# Patient Record
Sex: Male | Born: 1970 | Hispanic: No | Marital: Single | Smoking: Current every day smoker
Health system: Southern US, Community
[De-identification: ages and names within clinical notes are randomized; demographics above are authoritative.]

## PROBLEM LIST (undated history)

## (undated) DIAGNOSIS — M549 Dorsalgia, unspecified: Secondary | ICD-10-CM

## (undated) HISTORY — PX: HERNIA REPAIR: SHX51

---

## 2000-09-30 ENCOUNTER — Encounter: Payer: Self-pay | Admitting: Emergency Medicine

## 2000-09-30 ENCOUNTER — Emergency Department (HOSPITAL_COMMUNITY): Admission: EM | Admit: 2000-09-30 | Discharge: 2000-10-01 | Payer: Self-pay | Admitting: Emergency Medicine

## 2002-01-27 ENCOUNTER — Emergency Department (HOSPITAL_COMMUNITY): Admission: EM | Admit: 2002-01-27 | Discharge: 2002-01-27 | Payer: Self-pay | Admitting: *Deleted

## 2003-03-13 ENCOUNTER — Ambulatory Visit (HOSPITAL_COMMUNITY): Admission: RE | Admit: 2003-03-13 | Discharge: 2003-03-13 | Payer: Self-pay | Admitting: General Surgery

## 2012-07-30 ENCOUNTER — Emergency Department (HOSPITAL_COMMUNITY)
Admission: EM | Admit: 2012-07-30 | Discharge: 2012-07-30 | Disposition: A | Discharge status: Home / Self Care | Payer: Self-pay | Attending: Emergency Medicine | Admitting: Emergency Medicine

## 2012-07-30 ENCOUNTER — Encounter (HOSPITAL_COMMUNITY): Payer: Self-pay | Admitting: Physical Medicine and Rehabilitation

## 2012-07-30 DIAGNOSIS — M549 Dorsalgia, unspecified: Secondary | ICD-10-CM

## 2012-07-30 DIAGNOSIS — IMO0002 Reserved for concepts with insufficient information to code with codable children: Secondary | ICD-10-CM | POA: Insufficient documentation

## 2012-07-30 DIAGNOSIS — F17200 Nicotine dependence, unspecified, uncomplicated: Secondary | ICD-10-CM | POA: Insufficient documentation

## 2012-07-30 DIAGNOSIS — R296 Repeated falls: Secondary | ICD-10-CM | POA: Insufficient documentation

## 2012-07-30 DIAGNOSIS — Y9389 Activity, other specified: Secondary | ICD-10-CM | POA: Insufficient documentation

## 2012-07-30 DIAGNOSIS — Y929 Unspecified place or not applicable: Secondary | ICD-10-CM | POA: Insufficient documentation

## 2012-07-30 DIAGNOSIS — M5430 Sciatica, unspecified side: Secondary | ICD-10-CM

## 2012-07-30 HISTORY — DX: Dorsalgia, unspecified: M54.9

## 2012-07-30 MED ORDER — HYDROCODONE-ACETAMINOPHEN 5-325 MG PO TABS
2.0000 | ORAL_TABLET | Freq: Once | ORAL | Status: AC
  Administered 2012-07-30: 2 via ORAL
  Filled 2012-07-30: qty 2

## 2012-07-30 MED ORDER — IBUPROFEN 400 MG PO TABS
600.0000 mg | ORAL_TABLET | Freq: Once | ORAL | Status: AC
  Administered 2012-07-30: 600 mg via ORAL
  Filled 2012-07-30: qty 1

## 2012-07-30 MED ORDER — PREDNISONE 20 MG PO TABS: ORAL_TABLET | ORAL | Status: AC

## 2012-07-30 MED ORDER — HYDROCODONE-ACETAMINOPHEN 5-500 MG PO TABS: 1.0000 | ORAL_TABLET | Freq: Four times a day (QID) | ORAL | Status: AC | PRN

## 2012-07-30 NOTE — ED Notes (Signed)
Pt presents to department for evaluation of lower back pain radiating to both legs. Ongoing x2 days. Pt states chronic issues with same. 9/10 pain at the time. Denies numbness/tingling. Able to move all extremities. He is alert and oriented x4.

## 2012-07-30 NOTE — ED Provider Notes (Signed)
History  Scribed for Joseph Roots, MD, the patient was seen in room TR07C/TR07C. This chart was scribed by Candelaria Stagers. The patient's care started at 11:47 AM   CSN: [id number]  Arrival date & time 07/30/12  1133   First MD Initiated Contact with Patient 07/30/12 1142      Chief Complaint  Patient presents with  . Back Pain     The history is provided by the patient. No language interpreter was used.   Roland Rack is a 41 y.o. male who presents to the Emergency Department complaining of sharp back pain that started about eight months ago and has gotten worse over the last two days after working on his car.  Pt reports that the pain radiates down his legs.  Sitting makes the pain worse, and certain positions changes make worse. Pt constant. Radiating. No numbness or weakness. No problems walking. No gi or gu c/o. No fever or chills.   Past Medical History  Diagnosis Date  . Back pain     No past surgical history on file.  History reviewed. No pertinent family history.  History  Substance Use Topics  . Smoking status: Current Every Day Smoker    Types: Cigarettes  . Smokeless tobacco: Not on file  . Alcohol Use: No      Review of Systems  Constitutional: Negative for fever.  Gastrointestinal: Negative for vomiting.  Musculoskeletal: Positive for back pain.  Neurological: Negative for weakness and numbness.  All other systems reviewed and are negative.    Allergies  Review of patient's allergies indicates no known allergies.  Home Medications  No current outpatient prescriptions on file.  BP 105/65  Pulse 77  Temp 98.6 F (37 C) (Oral)  Resp 18  SpO2 99%  Physical Exam  Nursing note and vitals reviewed. Constitutional: He is oriented to person, place, and time. He appears well-developed and well-nourished. No distress.  HENT:  Head: Normocephalic and atraumatic.  Neck: Neck supple. No tracheal deviation present.  Cardiovascular: Normal rate.    Pulmonary/Chest: Effort normal. No respiratory distress.  Abdominal: Soft. There is no tenderness.  Musculoskeletal: Normal range of motion.       Spine non tender.  Lumbar muscular tenderness.  CTLS spine, non tender, aligned, no step off. Straight leg raise pos on right.  Neurological: He is alert and oriented to person, place, and time. He displays normal reflexes.       Motor intact bil. Steady gait.   Skin: Skin is warm and dry.  Psychiatric: He has a normal mood and affect. His behavior is normal.    ED Course  Procedures   DIAGNOSTIC STUDIES: Oxygen Saturation is 99% on room air, normal by my interpretation.    COORDINATION OF CARE:        MDM  I personally performed the services described in this documentation, which was scribed in my presence. The recorded information has been reviewed and is accurate.   Pt has ride, does not have to drive. vicodin po. Motrin po.  rx for home. Discussed need for pcp f/u and specialty referral then if symptoms persists or worsen.    Joseph Roots, MD 07/30/12 [phone]

## 2012-07-30 NOTE — ED Notes (Signed)
Pt laying in bed, reports he fell yesterday while working on his car and landed on bottom. Pt reports pain starts in sacrum and radiates down buttocks and bilateral lower extremities. Denies numbness and tingling.

## 2013-10-16 ENCOUNTER — Ambulatory Visit (INDEPENDENT_AMBULATORY_CARE_PROVIDER_SITE_OTHER): Payer: No Typology Code available for payment source | Admitting: Family Medicine

## 2013-10-16 ENCOUNTER — Ambulatory Visit: Payer: No Typology Code available for payment source

## 2013-10-16 VITALS — BP 98/64 | HR 74 | Temp 97.8°F | Resp 16 | Ht 67.75 in | Wt 128.0 lb

## 2013-10-16 DIAGNOSIS — M549 Dorsalgia, unspecified: Secondary | ICD-10-CM

## 2013-10-16 DIAGNOSIS — M25559 Pain in unspecified hip: Secondary | ICD-10-CM

## 2013-10-16 DIAGNOSIS — R634 Abnormal weight loss: Secondary | ICD-10-CM

## 2013-10-16 LAB — COMPREHENSIVE METABOLIC PANEL WITH GFR
Albumin: 4.6 g/dL (ref 3.5–5.2)
Alkaline Phosphatase: 66 U/L (ref 39–117)
BUN: 12 mg/dL (ref 6–23)
Creat: 0.87 mg/dL (ref 0.50–1.35)
Glucose, Bld: 91 mg/dL (ref 70–99)
Potassium: 4.2 meq/L (ref 3.5–5.3)

## 2013-10-16 LAB — COMPREHENSIVE METABOLIC PANEL
ALT: 11 U/L (ref 0–53)
AST: 15 U/L (ref 0–37)
CO2: 29 mEq/L (ref 19–32)
Calcium: 9.9 mg/dL (ref 8.4–10.5)
Chloride: 104 mEq/L (ref 96–112)
Sodium: 141 mEq/L (ref 135–145)
Total Bilirubin: 0.4 mg/dL (ref 0.2–1.2)
Total Protein: 7.5 g/dL (ref 6.0–8.3)

## 2013-10-16 LAB — TSH: TSH: 1.472 u[IU]/mL (ref 0.350–4.500)

## 2013-10-16 LAB — POCT CBC
Granulocyte percent: 59.9 %G (ref 37–80)
HCT, POC: 48.3 % (ref 43.5–53.7)
Hemoglobin: 14.7 g/dL (ref 14.1–18.1)
Lymph, poc: 2 (ref 0.6–3.4)
MCH, POC: 24.1 pg — AB (ref 27–31.2)
MCHC: 30.4 g/dL — AB (ref 31.8–35.4)
MCV: 79.1 fL — AB (ref 80–97)
MID (cbc): 0.5 (ref 0–0.9)
MPV: 12.7 fL (ref 0–99.8)
POC Granulocyte: 3.7 (ref 2–6.9)
POC LYMPH PERCENT: 32.5 %L (ref 10–50)
POC MID %: 7.6 %M (ref 0–12)
Platelet Count, POC: 203 10*3/uL (ref 142–424)
RBC: 6.11 M/uL (ref 4.69–6.13)
RDW, POC: 13.8 %
WBC: 6.1 10*3/uL (ref 4.6–10.2)

## 2013-10-16 NOTE — Patient Instructions (Signed)
Back Pain, Adult Low back pain is very common. About 1 in 5 people have back pain.The cause of low back pain is rarely dangerous. The pain often gets better over time.About half of people with a sudden onset of back pain feel better in just 2 weeks. About 8 in 10 people feel better by 6 weeks.  CAUSES Some common causes of back pain include:  Strain of the muscles or ligaments supporting the spine.  Wear and tear (degeneration) of the spinal discs.  Arthritis.  Direct injury to the back. DIAGNOSIS Most of the time, the direct cause of low back pain is not known.However, back pain can be treated effectively even when the exact cause of the pain is unknown.Answering your caregiver's questions about your overall health and symptoms is one of the most accurate ways to make sure the cause of your pain is not dangerous. If your caregiver needs more information, he or she may order lab work or imaging tests (X-rays or MRIs).However, even if imaging tests show changes in your back, this usually does not require surgery. HOME CARE INSTRUCTIONS For many people, back pain returns.Since low back pain is rarely dangerous, it is often a condition that people can learn to manageon their own.   Remain active. It is stressful on the back to sit or stand in one place. Do not sit, drive, or stand in one place for more than 30 minutes at a time. Take short walks on level surfaces as soon as pain allows.Try to increase the length of time you walk each day.  Do not stay in bed.Resting more than 1 or 2 days can delay your recovery.  Do not avoid exercise or work.Your body is made to move.It is not dangerous to be active, even though your back may hurt.Your back will likely heal faster if you return to being active before your pain is gone.  Pay attention to your body when you bend and lift. Many people have less discomfortwhen lifting if they bend their knees, keep the load close to their bodies,and  avoid twisting. Often, the most comfortable positions are those that put less stress on your recovering back.  Find a comfortable position to sleep. Use a firm mattress and lie on your side with your knees slightly bent. If you lie on your back, put a pillow under your knees.  Only take over-the-counter or prescription medicines as directed by your caregiver. Over-the-counter medicines to reduce pain and inflammation are often the most helpful.Your caregiver may prescribe muscle relaxant drugs.These medicines help dull your pain so you can more quickly return to your normal activities and healthy exercise.  Put ice on the injured area.  Put ice in a plastic bag.  Place a towel between your skin and the bag.  Leave the ice on for 15-20 minutes, 03-04 times a day for the first 2 to 3 days. After that, ice and heat may be alternated to reduce pain and spasms.  Ask your caregiver about trying back exercises and gentle massage. This may be of some benefit.  Avoid feeling anxious or stressed.Stress increases muscle tension and can worsen back pain.It is important to recognize when you are anxious or stressed and learn ways to manage it.Exercise is a great option. SEEK MEDICAL CARE IF:  You have pain that is not relieved with rest or medicine.  You have pain that does not improve in 1 week.  You have new symptoms.  You are generally not feeling well. SEEK   IMMEDIATE MEDICAL CARE IF:   You have pain that radiates from your back into your legs.  You develop new bowel or bladder control problems.  You have unusual weakness or numbness in your arms or legs.  You develop nausea or vomiting.  You develop abdominal pain.  You feel faint. Document Released: 08/03/2005 Document Revised: 02/02/2012 Document Reviewed: 12/22/2010 [employer] Patient Information 2014 [employer].  

## 2013-10-16 NOTE — Progress Notes (Addendum)
Chief Complaint:  Chief Complaint  Patient presents with  . Back Pain    x 2 years  . Leg Pain    HPI: Joseph Terrell is a 43 y.o. male who is here for leg and back pain. Back pain has been going on for x 2 years [place] recommended surgery but would like a second opinion. Has lost 46lbs in the last 6 mths due the pain. Has taken OTC pain medications but they have not helped at all. Sometimes he can't pick his son up, he can't walk longer than 30 min , he can't sit for long, sharp pain. He was seem by another doctor and was given pain meds but he states they did not help so he stopped using it.. He has taken vicodin, prednisone, and and ibuprofen. HE can only do things 30 minutes at a time, sit , stand. Even going to the bathroom hurst him. He has not had any night sweats or   Back pain started 2 years ago after he lift a door that was 450 lbs at home ,  And then he started having sharp debilitating pain whichdoes not allow his to do normal things. 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 He worked 1-2 days last year and had to stop and this year he only was able to work 1 day both jobs required lifting which he could not do. 4 months ago he went home and He saw an [place] doctor who gave him medicine which he did not take, he wanted to do surgery but the patient was scared and did not take the medicine or do the surgery, this was only after 1 visit. HE saw another doctor. HE is scheduled to see a specialist on March 18 for his back but can't wait that long. Denies incontinence or numbness/tinglgin. He can't eat because he is in so much pain so to ease his pain he  He has 10/10 pain. HE would like to go back to work and be able to pick up his kisd, ages 68 and 2   He has a specialist in [facility] for back pain on March 18th but wants something sooner.   Past Medical History    Diagnosis Date  . Back pain    Past Surgical History  Procedure Laterality Date  . Hernia repair     History   Social History  . Marital Status: Married with 2 kids, ages 21 and 2    Spouse Name: N/A    Number of Children: N/A  . Years of Education: N/A   Social History Main Topics  . Smoking status: Current Every Day Smoker    Types: Cigarettes  . Smokeless tobacco: None  . Alcohol Use: No  . Drug Use: No  . Sexual Activity:    Other Topics Concern  . None   Social History Narrative  . None   No family history on file. No Known Allergies Prior to Admission medications   Medication Sig Start Date End Date Taking? Authorizing Provider  HYDROcodone-acetaminophen (VICODIN) 5-500 MG per tablet Take 1-2 tablets by mouth every 6 (six) hours as needed for pain. 07/30/12  Yes Suzi Roots, MD  predniSONE (DELTASONE) 20 MG tablet 3 po once a day for 2 days, then 2 po once a day for 3 days, then 1  po once a day for 3 days 07/30/12   Suzi RootsKevin E Steinl, MD     ROS: The patient denies fevers, chills, night sweats, chest pain, palpitations, wheezing, dyspnea on exertion, nausea, vomiting, abdominal pain, dysuria, hematuria, melena, numbness, weakness, or tingling.   All other systems have been reviewed and were otherwise negative with the exception of those mentioned in the HPI and as above.    PHYSICAL EXAM: Filed Vitals:   10/16/13 0956  BP: 98/64  Pulse: 74  Temp: 97.8 F (36.6 C)  Resp: 16   Filed Vitals:   10/16/13 0956  Height: 5' 7.75" (1.721 m)  Weight: 128 lb (58.06 kg)   Body mass index is 19.6 kg/(m^2).  General: Alert, no acute distress HEENT:  Normocephalic, atraumatic, oropharynx patent. EOMI, PERRLA, no appreciable thyrpoidmegaly Cardiovascular:  Regular rate and rhythm, no rubs murmurs or gallops.  No Carotid bruits, radial pulse intact. No pedal edema.  Respiratory: Clear to auscultation bilaterally.  No wheezes, rales, or rhonchi.  No cyanosis, no use  of accessory musculature GI: No organomegaly, abdomen is soft and non-tender, positive bowel sounds.  No masses. Skin: No rashes. Neurologic: Facial musculature symmetric. Psychiatric: Patient is appropriate throughout our interaction. Lymphatic: No cervical lymphadenopathy Musculoskeletal: Gait intact. + paramsk tenderness  Bilateral LB, right greater than left, tenderness down to piriformis  Full ROM 5/5 strength, 2/2 DTRs No saddle anesthesia Straight leg positive left greater than right  Hip and knee exam--normal    LABS: Results for orders placed in visit on 10/16/13  POCT CBC      Result Value Ref Range   WBC 6.1  4.6 - 10.2 K/uL   Lymph, poc 2.0  0.6 - 3.4   POC LYMPH PERCENT 32.5  10 - 50 %L   MID (cbc) 0.5  0 - 0.9   POC MID % 7.6  0 - 12 %M   POC Granulocyte 3.7  2 - 6.9   Granulocyte percent 59.9  37 - 80 %G   RBC 6.11  4.69 - 6.13 M/uL   Hemoglobin 14.7  14.1 - 18.1 g/dL   HCT, POC 40.948.3  81.143.5 - 53.7 %   MCV 79.1 (*) 80 - 97 fL   MCH, POC 24.1 (*) 27 - 31.2 pg   MCHC 30.4 (*) 31.8 - 35.4 g/dL   RDW, POC 91.413.8     Platelet Count, POC 203  142 - 424 K/uL   MPV 12.7  0 - 99.8 fL     EKG/XRAY:   [facility]. No fx or dislocation of hips or l spine   ASSESSMENT/PLAN: Encounter Diagnoses  Name Primary?  . Back pain Yes  . Loss of weight   . Hip pain    ? Etiology of his weight loss due to back pain, I do not suspect any immunocompromise. No LAD, no fevers, chills, night sweats. CMP and TSH pending He states it is strictly from back pain, he can't eat due to pain so he just smokes He deneis numbness, weakness or tingling; strictly pain He was given prednsione, ibuprofen and vicodin in the past and I checked with pharmacy that he got these meds once and sicne they did not help him he did nto take them He would like tos ee a specialist, he ahs an appt on March 18 with a specialist but he would like one sooner I will refer him to  [facility] for further eval today since that  is what he is requesting Appt made for 2:45 with Dr Farris Has F/u prn  Gross sideeffects, risk and benefits, and alternatives of medications d/w patient. Patient is aware that all medications have potential sideeffects and we are unable to predict every sideeffect or drug-drug interaction that may occur.  Hamilton Capri PHUONG, DO 10/16/2013 12:51 PM

## 2013-10-26 ENCOUNTER — Encounter: Payer: Self-pay | Admitting: Family Medicine

## 2021-03-25 ENCOUNTER — Emergency Department (HOSPITAL_COMMUNITY)
Admission: EM | Admit: 2021-03-25 | Discharge: 2021-03-26 | Disposition: A | Discharge status: Home / Self Care | Payer: Self-pay | Attending: Emergency Medicine | Admitting: Emergency Medicine

## 2021-03-25 ENCOUNTER — Encounter (HOSPITAL_COMMUNITY): Payer: Self-pay | Admitting: Pharmacy Technician

## 2021-03-25 ENCOUNTER — Other Ambulatory Visit: Payer: Self-pay

## 2021-03-25 DIAGNOSIS — R519 Headache, unspecified: Secondary | ICD-10-CM | POA: Insufficient documentation

## 2021-03-25 DIAGNOSIS — R200 Anesthesia of skin: Secondary | ICD-10-CM | POA: Insufficient documentation

## 2021-03-25 DIAGNOSIS — F17210 Nicotine dependence, cigarettes, uncomplicated: Secondary | ICD-10-CM | POA: Insufficient documentation

## 2021-03-25 DIAGNOSIS — R42 Dizziness and giddiness: Secondary | ICD-10-CM | POA: Insufficient documentation

## 2021-03-25 LAB — COMPREHENSIVE METABOLIC PANEL
ALT: 18 U/L (ref 0–44)
AST: 19 U/L (ref 15–41)
Albumin: 3.9 g/dL (ref 3.5–5.0)
Alkaline Phosphatase: 55 U/L (ref 38–126)
Anion gap: 5 (ref 5–15)
BUN: 15 mg/dL (ref 6–20)
CO2: 27 mmol/L (ref 22–32)
Calcium: 9.4 mg/dL (ref 8.9–10.3)
Chloride: 107 mmol/L (ref 98–111)
Creatinine, Ser: 1.24 mg/dL (ref 0.61–1.24)
GFR, Estimated: >60 mL/min (ref >60)
Glucose, Bld: 86 mg/dL (ref 70–99)
Potassium: 4.2 mmol/L (ref 3.5–5.1)
Sodium: 139 mmol/L (ref 135–145)
Total Bilirubin: 0.6 mg/dL (ref 0.3–1.2)
Total Protein: 6.3 g/dL — ABNORMAL LOW (ref 6.5–8.1)

## 2021-03-25 LAB — CBC WITH DIFFERENTIAL/PLATELET
Abs Immature Granulocytes: 0.01 10*3/uL (ref 0.00–0.07)
Basophils Absolute: 0.1 10*3/uL (ref 0.0–0.1)
Basophils Relative: 1 %
Eosinophils Absolute: 0.1 10*3/uL (ref 0.0–0.5)
Eosinophils Relative: 2 %
HCT: 44.7 % (ref 39.0–52.0)
Hemoglobin: 13.4 g/dL (ref 13.0–17.0)
Immature Granulocytes: 0 %
Lymphocytes Relative: 26 %
Lymphs Abs: 1.8 10*3/uL (ref 0.7–4.0)
MCH: 23.4 pg — ABNORMAL LOW (ref 26.0–34.0)
MCHC: 30 g/dL (ref 30.0–36.0)
MCV: 78.1 fL — ABNORMAL LOW (ref 80.0–100.0)
Monocytes Absolute: 0.6 10*3/uL (ref 0.1–1.0)
Monocytes Relative: 9 %
Neutro Abs: 4.3 10*3/uL (ref 1.7–7.7)
Neutrophils Relative %: 62 %
Platelets: 247 10*3/uL (ref 150–400)
RBC: 5.72 MIL/uL (ref 4.22–5.81)
RDW: 13.6 % (ref 11.5–15.5)
WBC: 6.8 10*3/uL (ref 4.0–10.5)
nRBC: 0 % (ref 0.0–0.2)

## 2021-03-25 NOTE — ED Triage Notes (Signed)
Pt here with reports of dizziness, headache, fatigue, L arm numbness X6 days. Pt also complains of decreased po intake. Denies known sick contacts.

## 2021-03-25 NOTE — ED Provider Notes (Signed)
Emergency Medicine Provider Triage Evaluation Note  Joseph Terrell , a 50 y.o. male  was evaluated in triage.  Pt complains of generalized weakness, left arm pain, fatigue for the past 2 weeks.  Also endorsing weight loss, states he is unable to drive, move his left arm these episodes occur intermittently.  He does not have a primary care physician, does endorse tobacco use up until 2019 but quit.  Review of Systems  Positive: Fatigue, weakness, left arm numbnes Negative: Headache, chest pain, shortness of breath  Physical Exam  BP 114/77 (BP Location: Left Arm)   Pulse 80   Temp 98.4 F (36.9 C)   Resp 16   SpO2 99%  Gen:   Cachectic, temporal wasting Resp:  Normal effort  MSK:   Moves extremities without difficulty  Other:  Full range of motion of the left arm, with bilateral upper equal strength.  Patient appears chronically ill, denies any medical history.  Medical Decision Making  Medically screening exam initiated at 4:43 PM.  Appropriate orders placed.  Olamide Wheeland was informed that the remainder of the evaluation will be completed by another provider, this initial triage assessment does not replace that evaluation, and the importance of remaining in the ED until their evaluation is complete.     Claude Manges, PA-C 03/25/21 1645    Margarita Grizzle, MD 03/25/21 2259

## 2021-03-26 ENCOUNTER — Emergency Department (HOSPITAL_COMMUNITY): Payer: Self-pay

## 2021-03-26 MED ORDER — MECLIZINE HCL 25 MG PO TABS
25.0000 mg | ORAL_TABLET | Freq: Once | ORAL | Status: AC
  Administered 2021-03-26: 25 mg via ORAL
  Filled 2021-03-26: qty 1

## 2021-03-26 MED ORDER — MECLIZINE HCL 25 MG PO TABS: 25.0000 mg | ORAL_TABLET | Freq: Three times a day (TID) | ORAL | 0 refills | Status: AC | PRN

## 2021-03-26 NOTE — ED Notes (Signed)
Pt taken to CT.

## 2021-03-26 NOTE — ED Provider Notes (Signed)
[facility] EMERGENCY DEPARTMENT Provider Note   CSN: [id number] Arrival date & time: 03/25/21  1603     History Chief Complaint  Patient presents with   Fatigue   Dizziness   Headache    Joseph Terrell is a 50 y.o. male.  Patient is a 50 year old male with no significant past medical history presenting today with complaints of dizziness.  For the past two days he has felt, off balance and has experienced what he describes as a "spinning sensation".  He denies chest pain, nausea, headaches, or fevers.  He does report some numbness of the left arm, but no weakness.    The history is provided by the patient.  Dizziness Quality:  Imbalance and room spinning Severity:  Moderate Onset quality:  Gradual Duration:  2 days Timing:  Intermittent Progression:  Worsening Chronicity:  New Context: standing up   Relieved by:  Nothing Worsened by:  Standing up Ineffective treatments:  None tried Associated symptoms: headaches   Headache Associated symptoms: dizziness       Past Medical History:  Diagnosis Date   Back pain     There are no problems to display for this patient.   Past Surgical History:  Procedure Laterality Date   HERNIA REPAIR         No family history on file.  Social History   Tobacco Use   Smoking status: Every Day    Types: Cigarettes  Substance Use Topics   Alcohol use: No   Drug use: No    Home Medications Prior to Admission medications   Medication Sig Start Date End Date Taking? Authorizing Provider  HYDROcodone-acetaminophen (VICODIN) 5-500 MG per tablet Take 1-2 tablets by mouth every 6 (six) hours as needed for pain. 07/30/12   Cathren Laine, MD  predniSONE (DELTASONE) 20 MG tablet 3 po once a day for 2 days, then 2 po once a day for 3 days, then 1 po once a day for 3 days 07/30/12   Cathren Laine, MD    Allergies    Patient has no known allergies.  Review of Systems   Review of Systems  Neurological:   Positive for dizziness and headaches.  All other systems reviewed and are negative.  Physical Exam Updated Vital Signs BP 114/68 (BP Location: Left Arm)   Pulse 64   Temp 98.4 F (36.9 C)   Resp 16   SpO2 100%   Physical Exam Vitals and nursing note reviewed.  Constitutional:      General: He is not in acute distress.    Appearance: He is well-developed. He is not diaphoretic.  HENT:     Head: Normocephalic and atraumatic.  Eyes:     General: No visual field deficit.    Extraocular Movements: Extraocular movements intact.     Pupils: Pupils are equal, round, and reactive to light.  Cardiovascular:     Rate and Rhythm: Normal rate and regular rhythm.     Heart sounds: No murmur heard.   No friction rub.  Pulmonary:     Effort: Pulmonary effort is normal. No respiratory distress.     Breath sounds: Normal breath sounds. No wheezing or rales.  Abdominal:     General: Bowel sounds are normal. There is no distension.     Palpations: Abdomen is soft.     Tenderness: There is no abdominal tenderness.  Musculoskeletal:        General: Normal range of motion.  Cervical back: Normal range of motion and neck supple.  Skin:    General: Skin is warm and dry.  Neurological:     Mental Status: He is alert and oriented to person, place, and time.     Cranial Nerves: No cranial nerve deficit, dysarthria or facial asymmetry.     Coordination: Coordination normal.    ED Results / Procedures / Treatments   Labs (all labs ordered are listed, but only abnormal results are displayed) Labs Reviewed  CBC WITH DIFFERENTIAL/PLATELET - Abnormal; Notable for the following components:      Result Value   MCV 78.1 (*)    MCH 23.4 (*)    All other components within normal limits  COMPREHENSIVE METABOLIC PANEL - Abnormal; Notable for the following components:   Total Protein 6.3 (*)    All other components within normal limits    EKG None  Radiology No results  found.  Procedures Procedures   Medications Ordered in ED Medications  meclizine (ANTIVERT) tablet 25 mg (has no administration in time range)    ED Course  I have reviewed the triage vital signs and the nursing notes.  Pertinent labs & imaging results that were available during my care of the patient were reviewed by me and considered in my medical decision making (see chart for details).    MDM Rules/Calculators/A&P  Patient is a 50 year old male presenting with complaints of dizziness as described in the HPI.  I suspect vertigo as the etiology.  He is neurologically intact and head CT and laboratory studies are unremarkable.  At this point, I feel as though discharge is appropriate with meclizine and as needed return.  Final Clinical Impression(s) / ED Diagnoses Final diagnoses:  None    Rx / DC Orders ED Discharge Orders     None        Geoffery Lyons, MD 03/26/21 [id number]

## 2021-03-26 NOTE — Discharge Instructions (Addendum)
Begin taking meclizine as prescribed.  Return to the emergency department if you develop worsening dizziness, severe headache, or other new and concerning symptoms.

## 2021-03-26 NOTE — ED Notes (Signed)
Patient transported to CT 

## 2022-02-12 IMAGING — CT CT HEAD W/O CM
4 series · 17 of 47 positions shown, 19 images · non-contrast
Comparison: None.

CLINICAL DATA: Dizziness and headaches

EXAM:
CT HEAD WITHOUT CONTRAST
TECHNIQUE: Contiguous axial images were obtained from the base of the skull
through the vertex without intravenous contrast.

[Series 3: head wo · axial · 0.43mm/px · z∈[+946,+1061]mm · 7 of 31 slices shown, 9 images]
[im 4/31  brain]
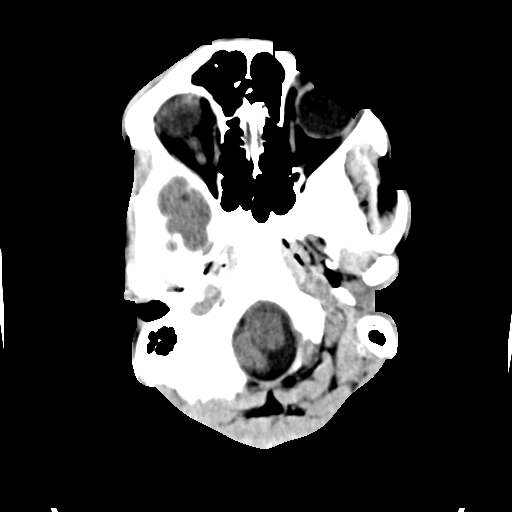
[im 4/31  bone]
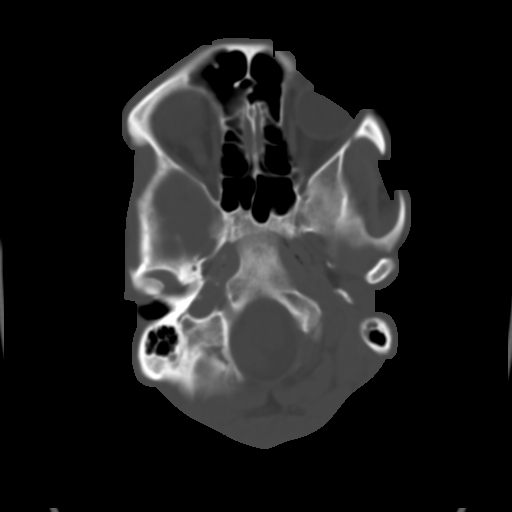
[im 8/31  brain]
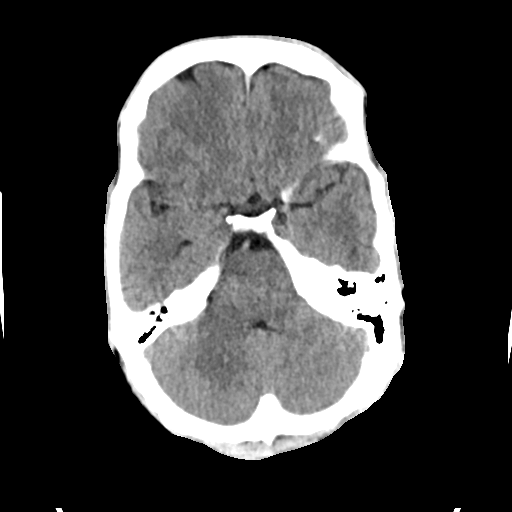
[im 12/31  brain]
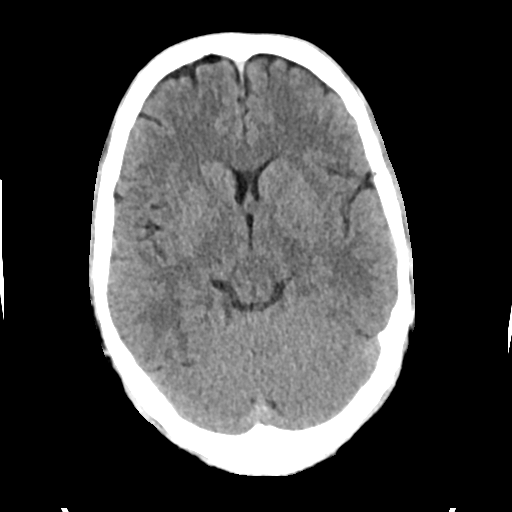
[im 16/31  brain]
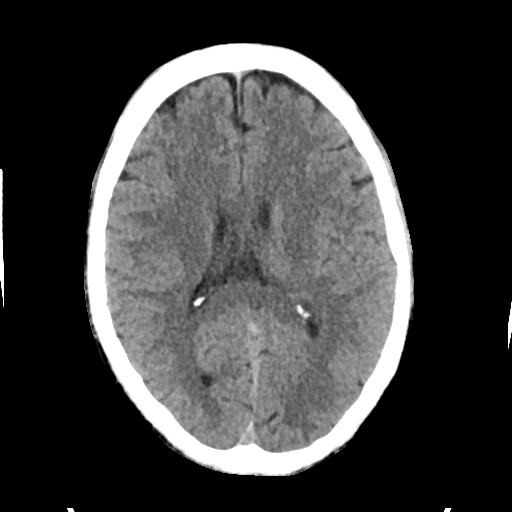
[im 19/31  brain]
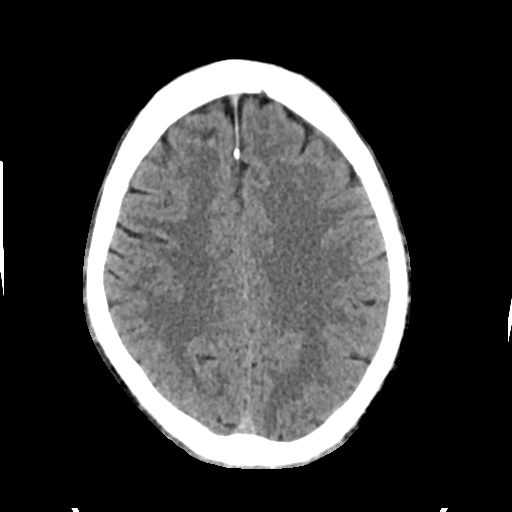
[im 19/31  bone]
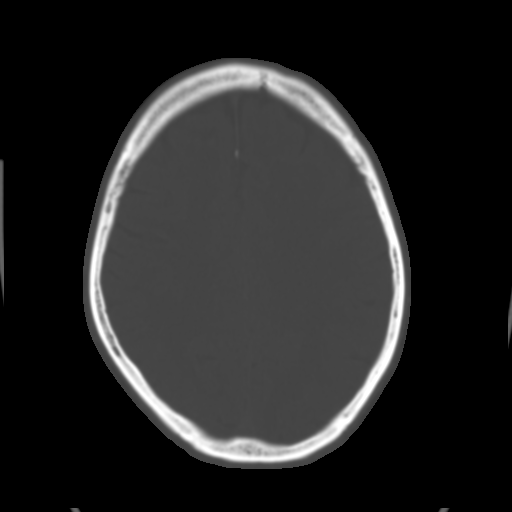
[im 23/31  brain]
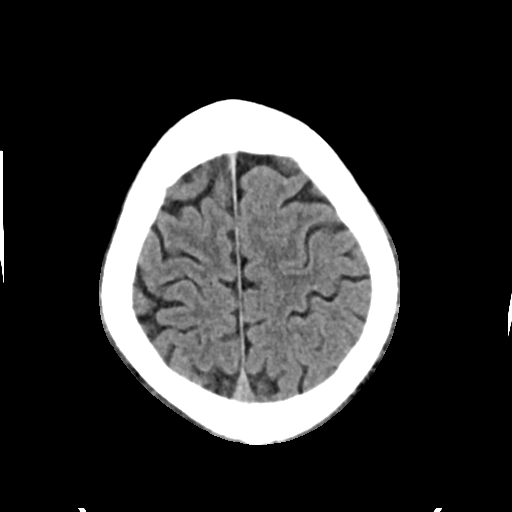
[im 27/31  brain]
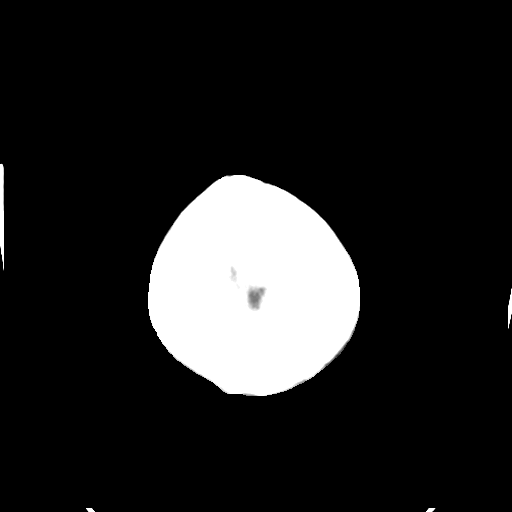

[Series 4: head bone · axial · 0.43mm/px · z∈[+945,+997]mm · 4 of 76 slices shown]
[im 8/76  bone]
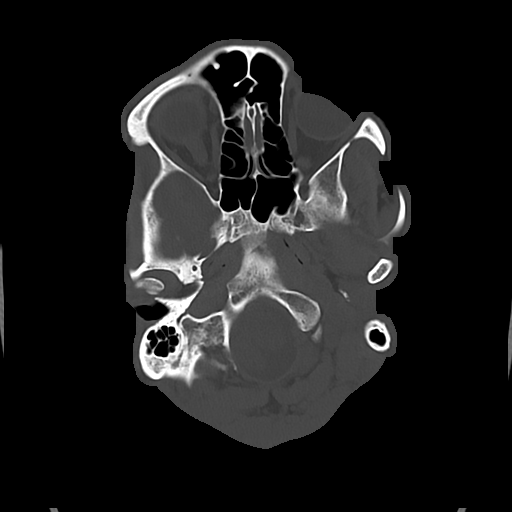
[im 16/76  bone]
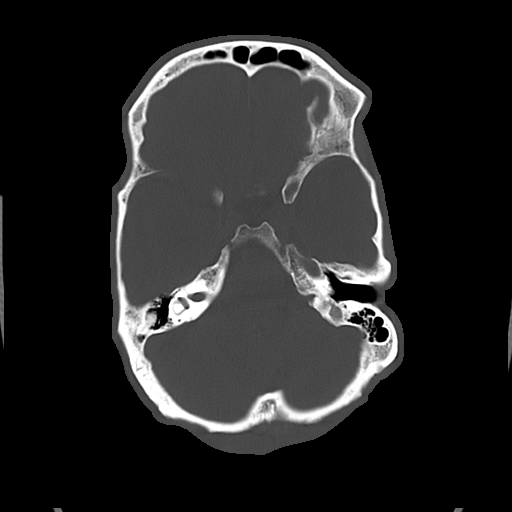
[im 23/76  bone]
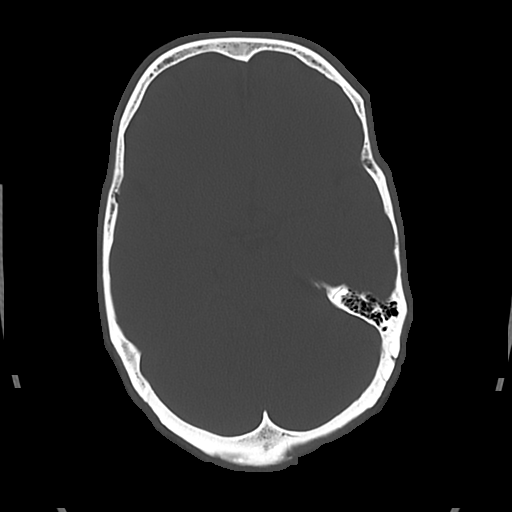
[im 34/76  bone]
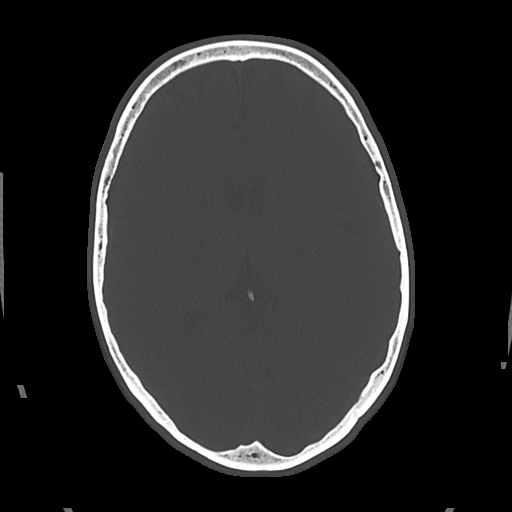

[Series 5: cor soft · coronal · 0.33mm/px · 3 of 70 slices shown]
[im 24/70  brain]
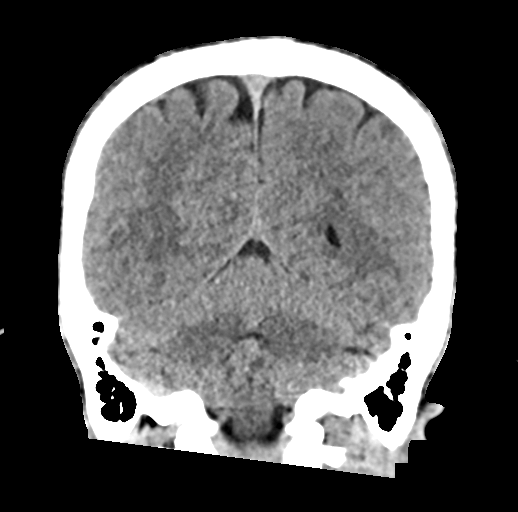
[im 31/70  brain]
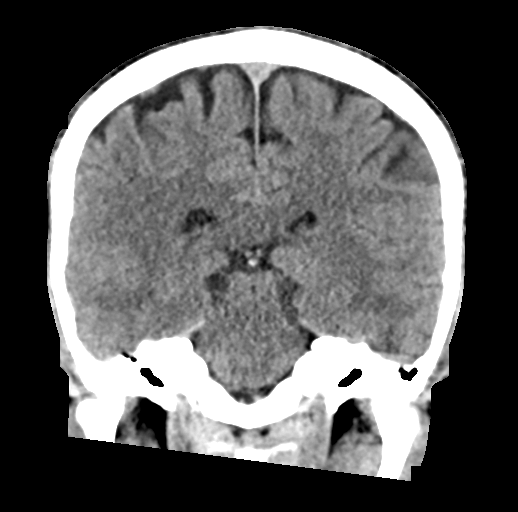
[im 39/70  brain]
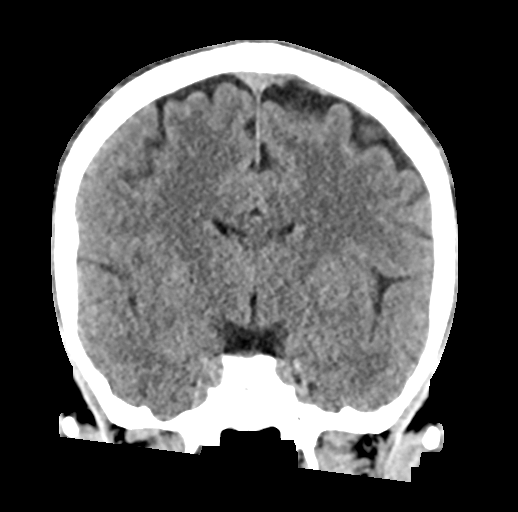

[Series 6: sag soft · sagittal · 0.33mm/px · 3 of 58 slices shown]
[im 21/58  brain]
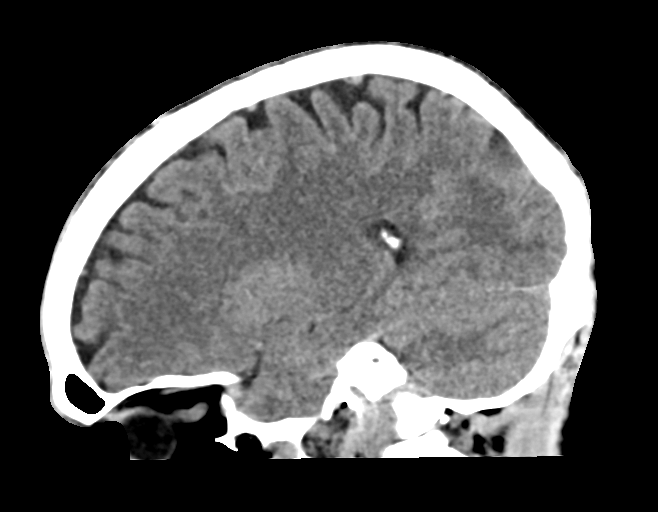
[im 29/58  brain]
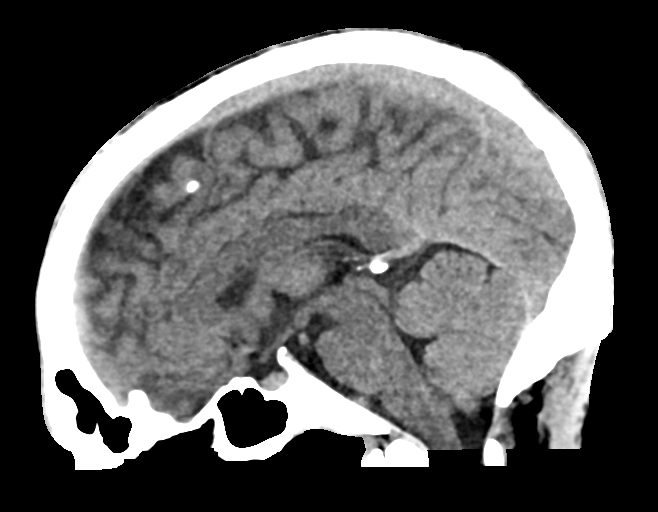
[im 38/58  brain]
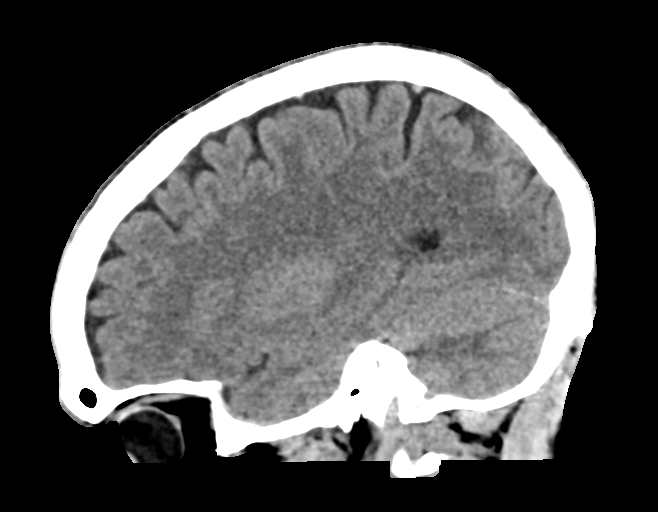

[17 of 47 positions shown; findings below may reference images not displayed]

FINDINGS: Brain: No evidence of acute infarction, hemorrhage, hydrocephalus,
extra-axial collection or mass lesion/mass effect.

Vascular: No hyperdense vessel or unexpected calcification.

Skull: Normal. Negative for fracture or focal lesion.

Sinuses/Orbits: No acute finding.

Other: None.
IMPRESSION: No acute intracranial abnormality noted.
